# Patient Record
Sex: Female | Born: 1975 | Race: White | Hispanic: No | Marital: Married | Smoking: Former smoker
Health system: Southern US, Community
[De-identification: ages and names within clinical notes are randomized; demographics above are authoritative.]

## PROBLEM LIST (undated history)

## (undated) DIAGNOSIS — E119 Type 2 diabetes mellitus without complications: Secondary | ICD-10-CM

## (undated) HISTORY — DX: Type 2 diabetes mellitus without complications: E11.9

## (undated) HISTORY — PX: WISDOM TOOTH EXTRACTION: SHX21

---

## 2015-02-09 ENCOUNTER — Other Ambulatory Visit (HOSPITAL_COMMUNITY): Payer: Self-pay | Admitting: Family Medicine

## 2015-02-09 DIAGNOSIS — R1011 Right upper quadrant pain: Secondary | ICD-10-CM

## 2015-02-11 ENCOUNTER — Ambulatory Visit (HOSPITAL_COMMUNITY): Payer: 59

## 2015-02-13 ENCOUNTER — Ambulatory Visit (HOSPITAL_COMMUNITY): Payer: 59

## 2015-03-13 ENCOUNTER — Ambulatory Visit (HOSPITAL_COMMUNITY): Payer: 59

## 2015-03-27 ENCOUNTER — Ambulatory Visit (HOSPITAL_COMMUNITY)
Admission: RE | Admit: 2015-03-27 | Discharge: 2015-03-27 | Disposition: A | Discharge status: Home / Self Care | Payer: 59 | Source: Ambulatory Visit | Attending: Family Medicine | Admitting: Family Medicine

## 2015-03-27 DIAGNOSIS — R1011 Right upper quadrant pain: Secondary | ICD-10-CM | POA: Diagnosis present

## 2016-01-20 ENCOUNTER — Ambulatory Visit: Payer: Self-pay | Admitting: Gynecology

## 2016-02-05 ENCOUNTER — Encounter: Payer: Self-pay | Admitting: Gynecology

## 2016-02-05 ENCOUNTER — Ambulatory Visit (INDEPENDENT_AMBULATORY_CARE_PROVIDER_SITE_OTHER): Payer: BLUE CROSS/BLUE SHIELD | Admitting: Gynecology

## 2016-02-05 VITALS — BP 112/78 | Ht 62.0 in | Wt 154.0 lb

## 2016-02-05 DIAGNOSIS — R896 Abnormal cytological findings in specimens from other organs, systems and tissues: Secondary | ICD-10-CM

## 2016-02-05 DIAGNOSIS — IMO0002 Reserved for concepts with insufficient information to code with codable children: Secondary | ICD-10-CM | POA: Insufficient documentation

## 2016-02-05 NOTE — Progress Notes (Signed)
   Patient is a 40 year old gravida 3 para 3 who was referred to our practice as a courtesy of her PCP Dr. Keturah Barre at [facility] as a result of patient's abnormal Pap smear. We will reviewed his office notes which indicated the following:  Patient had an abnormal Pap smear 06/26/2015 whereby the interpretation was atypical squamous cells of undetermined significance negative HPV.  Patient had a repeat Pap smear 12/25/2015 whereby low-grade squamous intraepithelial lesion (mild dysplasia) was seen interpretation.  Patient stated that many years ago when she was younger she had some form of cervical biopsy but does not recall she had any form of treatment. She is currently using condoms for contraception.  Patient was counseled for colposcopic evaluation. A systematic inspection of the external genitalia, perineum, perirectal region failed to demonstrate any abnormality. The speculum was then introduced into the vagina. Acetic acid was applied. The vagina and fornix were inspected and no lesions were noted. There was evidence of a leukoplakic area at the 6:00 position of the ectocervix and no other lesions were seen. The endocervical speculum was utilized transformation sound was visualized as well. An ECC was obtained as well as a 6:00 ectocervical biopsy and submitted for histological evaluation. The patient below demonstrates the findings:  Physical Exam  Genitourinary:     Assessment/plan: Patient with prior history of ASCUS on Pap smear 6 months later Pap smear demonstrating low-grade squamous intraepithelial lesion. Detail colposcopic evaluation as described above. Biopsies obtained from the areas delineated above. Pathology report pending. Literature information was presented to the patient. The new ASCCP guidelines were discussed on management of abnormal Pap smears and biopsy results. Silver nitrate and Monsel solution was used for hemostasis at the biopsy sites.  Patient tolerated procedure well was given an Aleve before being released home.

## 2016-02-05 NOTE — Patient Instructions (Signed)
Colposcopy  Care After  Colposcopy is a procedure in which a special tool is used to magnify the surface of the cervix. A tissue sample (biopsy) may also be taken. This sample will be looked at for cervical cancer or other problems. After the test:  · You may have some cramping.  · Lie down for a few minutes if you feel lightheaded.  ·  You may have some bleeding which should stop in a few days.  HOME CARE  · Do not have sex or use tampons for 2 to 3 days or as told.  · Only take medicine as told by your doctor.  · Continue to take your birth control pills as usual.  Finding out the results of your test  Ask when your test results will be ready. Make sure you get your test results.  GET HELP RIGHT AWAY IF:  · You are bleeding a lot or are passing blood clots.  · You develop a fever of 102° F (38.9° C) or higher.  · You have abnormal vaginal discharge.  · You have cramps that do not go away with medicine.  · You feel lightheaded, dizzy, or pass out (faint).  MAKE SURE YOU:   · Understand these instructions.  · Will watch your condition.  · Will get help right away if you are not doing well or get worse.     This information is not intended to replace advice given to you by your health care provider. Make sure you discuss any questions you have with your health care provider.     Document Released: 05/16/2008 Document Revised: 02/20/2012 Document Reviewed: 06/27/2013  Elsevier Interactive Patient Education ©2016 [employer]

## 2016-04-21 ENCOUNTER — Telehealth: Payer: Self-pay

## 2016-04-21 NOTE — Telephone Encounter (Signed)
Week and repeat her Pap smear in 6 months instead of 3 months at that for close surveillance

## 2016-04-21 NOTE — Telephone Encounter (Signed)
Toniann FailWendy informed. 6 mos recall placed.

## 2016-04-21 NOTE — Telephone Encounter (Signed)
I received following note from AplingtonWendy at front desk:  Janelle FloorWendy R Lawrence  Clotile Whittington R Valjean Ruppel, RMA           This patient received her 4680m recall for pap with HPV testing. JF had in notes for her to return for that. She states she cannot afford to come in for this as she is still paying for the colpo bx pathology. Didn't know if this needed to be noted in the chart. Thanks

## 2017-03-02 ENCOUNTER — Emergency Department (HOSPITAL_COMMUNITY): Payer: BLUE CROSS/BLUE SHIELD

## 2017-03-02 ENCOUNTER — Encounter (HOSPITAL_COMMUNITY): Payer: Self-pay | Admitting: Nurse Practitioner

## 2017-03-02 ENCOUNTER — Emergency Department (HOSPITAL_COMMUNITY)
Admission: EM | Admit: 2017-03-02 | Discharge: 2017-03-02 | Disposition: A | Discharge status: Home / Self Care | Payer: BLUE CROSS/BLUE SHIELD | Attending: Emergency Medicine | Admitting: Emergency Medicine

## 2017-03-02 DIAGNOSIS — Z91040 Latex allergy status: Secondary | ICD-10-CM | POA: Diagnosis not present

## 2017-03-02 DIAGNOSIS — E119 Type 2 diabetes mellitus without complications: Secondary | ICD-10-CM | POA: Diagnosis not present

## 2017-03-02 DIAGNOSIS — Z79899 Other long term (current) drug therapy: Secondary | ICD-10-CM | POA: Insufficient documentation

## 2017-03-02 DIAGNOSIS — Z87891 Personal history of nicotine dependence: Secondary | ICD-10-CM | POA: Diagnosis not present

## 2017-03-02 DIAGNOSIS — R109 Unspecified abdominal pain: Secondary | ICD-10-CM | POA: Diagnosis present

## 2017-03-02 DIAGNOSIS — K8500 Idiopathic acute pancreatitis without necrosis or infection: Secondary | ICD-10-CM | POA: Insufficient documentation

## 2017-03-02 LAB — CBC WITH DIFFERENTIAL/PLATELET
BASOS ABS: 0 10*3/uL (ref 0.0–0.1)
BASOS PCT: 0 %
Eosinophils Absolute: 0.2 10*3/uL (ref 0.0–0.7)
Eosinophils Relative: 1 %
HEMATOCRIT: 47.8 % — AB (ref 36.0–46.0)
HEMOGLOBIN: 16.9 g/dL — AB (ref 12.0–15.0)
Lymphocytes Relative: 11 %
Lymphs Abs: 1.9 10*3/uL (ref 0.7–4.0)
MCH: 29.5 pg (ref 26.0–34.0)
MCHC: 35.4 g/dL (ref 30.0–36.0)
MCV: 83.4 fL (ref 78.0–100.0)
Monocytes Absolute: 0.6 10*3/uL (ref 0.1–1.0)
Monocytes Relative: 3 %
NEUTROS ABS: 14.1 10*3/uL — AB (ref 1.7–7.7)
NEUTROS PCT: 85 %
Platelets: 288 10*3/uL (ref 150–400)
RBC: 5.73 MIL/uL — ABNORMAL HIGH (ref 3.87–5.11)
RDW: 13 % (ref 11.5–15.5)
WBC: 16.9 10*3/uL — AB (ref 4.0–10.5)

## 2017-03-02 LAB — URINALYSIS, ROUTINE W REFLEX MICROSCOPIC
Bacteria, UA: NONE SEEN
Bilirubin Urine: NEGATIVE
Glucose, UA: NEGATIVE mg/dL
Ketones, ur: NEGATIVE mg/dL
Leukocytes, UA: NEGATIVE
Nitrite: NEGATIVE
PH: 7 (ref 5.0–8.0)
Protein, ur: NEGATIVE mg/dL
SPECIFIC GRAVITY, URINE: 1.013 (ref 1.005–1.030)

## 2017-03-02 LAB — COMPREHENSIVE METABOLIC PANEL
ALBUMIN: 4.2 g/dL (ref 3.5–5.0)
ALK PHOS: 113 U/L (ref 38–126)
ALT: 16 U/L (ref 14–54)
ANION GAP: 10 (ref 5–15)
AST: 19 U/L (ref 15–41)
BILIRUBIN TOTAL: 0.5 mg/dL (ref 0.3–1.2)
BUN: 13 mg/dL (ref 6–20)
CALCIUM: 9 mg/dL (ref 8.9–10.3)
CO2: 24 mmol/L (ref 22–32)
Chloride: 102 mmol/L (ref 101–111)
Creatinine, Ser: 0.85 mg/dL (ref 0.44–1.00)
GFR calc non Af Amer: >60 mL/min (ref >60)
GLUCOSE: 106 mg/dL — AB (ref 65–99)
POTASSIUM: 4.2 mmol/L (ref 3.5–5.1)
SODIUM: 136 mmol/L (ref 135–145)
TOTAL PROTEIN: 7.9 g/dL (ref 6.5–8.1)

## 2017-03-02 LAB — I-STAT BETA HCG BLOOD, ED (MC, WL, AP ONLY)

## 2017-03-02 LAB — POC URINE PREG, ED: PREG TEST UR: NEGATIVE

## 2017-03-02 LAB — LIPASE, BLOOD: Lipase: 315 U/L — ABNORMAL HIGH (ref 11–51)

## 2017-03-02 MED ORDER — MORPHINE SULFATE 15 MG PO TABS: 15.0000 mg | ORAL_TABLET | ORAL | 0 refills | Status: AC | PRN

## 2017-03-02 MED ORDER — SODIUM BICARBONATE 8.4 % IV SOLN: 100.0000 meq | Freq: Once | INTRAVENOUS | Status: DC

## 2017-03-02 MED ORDER — MORPHINE SULFATE (PF) 4 MG/ML IV SOLN
4.0000 mg | Freq: Once | INTRAVENOUS | Status: DC
Start: 2017-03-02 — End: 2017-03-02

## 2017-03-02 MED ORDER — ONDANSETRON HCL 4 MG/2ML IJ SOLN: 4.0000 mg | Freq: Once | INTRAMUSCULAR | Status: DC

## 2017-03-02 MED ORDER — ONDANSETRON 4 MG PO TBDP: ORAL_TABLET | ORAL | 0 refills | Status: AC

## 2017-03-02 NOTE — ED Notes (Signed)
Bed: WA11 Expected date:  Expected time:  Means of arrival:  Comments: EMS abdominal pain 

## 2017-03-02 NOTE — Discharge Instructions (Signed)
Follow up with your PCP and GI.   

## 2017-03-02 NOTE — ED Notes (Signed)
Patient transported to CT 

## 2017-03-02 NOTE — ED Triage Notes (Signed)
Patient was driving had a sudden onset of nausea. Patient went home and nausea continued with dizziness. Patient also has severe right lower abdominal pain. Patient report she has an IUD no chance of pregnancy and she had her period last week.

## 2017-03-02 NOTE — ED Provider Notes (Signed)
[facility]-EMERGENCY DEPT Provider Note   CSN: [id number] Arrival date & time: 03/02/17  [id number]     History   Chief Complaint No chief complaint on file.   HPI Taylor GoodellJennifer Hill is a 41 y.o. female.  [age] yoF with a chief complaint of right-sided abdominal pain. This been going and coming for the past week or so. Patient is severe when it happens feels like a cramp radiates down to her pelvis. Nothing seems to make it better or worse and then spontaneously resolves. Today she had recurrence of this pain had some retching but no vomiting. Denies fevers or chills. Denies dysuria. Patient has had some vaginal bleeding and discharge since she had a IUD placed. She denies likelihood being pregnant.   The history is provided by the patient.  Abdominal Pain   This is a new problem. The current episode started more than 2 days ago. The problem occurs constantly. The problem has not changed since onset.The pain is located in the periumbilical region. The quality of the pain is shooting and sharp. The pain is at a severity of 1/10. The pain is severe. Associated symptoms include nausea. Pertinent negatives include fever, vomiting, dysuria, headaches, arthralgias and myalgias. Nothing aggravates the symptoms. Nothing relieves the symptoms.    Past Medical History:  Diagnosis Date  . Diabetes mellitus without complication [facility](HCC)    gestational with 3rd pregnancy     Patient Active Problem List   Diagnosis Date Noted  . LGSIL (low grade squamous intraepithelial dysplasia) 02/05/2016    Past Surgical History:  Procedure Laterality Date  . WISDOM TOOTH EXTRACTION      OB History    Gravida Para Term Preterm AB Living   3 3       3    SAB TAB Ectopic Multiple Live Births                   Home Medications    Prior to Admission medications   Medication Sig Start Date End Date Taking? Authorizing Provider  ADDERALL XR 10 MG 24 hr capsule Take 10 mg by mouth daily. 02/23/17  Yes Historical  Provider, MD  levonorgestrel (MIRENA) 20 MCG/24HR IUD 1 each by Intrauterine route once.   Yes Historical Provider, MD  Multiple Vitamin (MULTIVITAMIN WITH MINERALS) TABS tablet Take 1 tablet by mouth daily.   Yes Historical Provider, MD  OVER THE COUNTER MEDICATION Take 1 tablet by mouth daily. Ashwagandha supplement   Yes Historical Provider, MD  morphine (MSIR) 15 MG tablet Take 1 tablet (15 mg total) by mouth every 4 (four) hours as needed for severe pain. 03/02/17   Melene Planan Tina Gruner, DO  ondansetron (ZOFRAN ODT) 4 MG disintegrating tablet 4mg  ODT q4 hours prn nausea/vomit 03/02/17   Melene Planan Cady Hafen, DO    Family History Family History  Problem Relation Age of Onset  . Diabetes Mother   . Hypertension Mother   . Cancer Father     skin  . Breast cancer Maternal Grandmother   . Cancer Paternal Grandmother     melanoma    Social History Social History  Substance Use Topics  . Smoking status: Former Games developermoker  . Smokeless tobacco: Never Used  . Alcohol use 0.0 oz/week     Comment: rare     Allergies   Latex   Review of Systems Review of Systems  Constitutional: Negative for chills and fever.  HENT: Negative for congestion and rhinorrhea.   Eyes: Negative for redness and visual disturbance.  Respiratory:  Negative for shortness of breath and wheezing.   Cardiovascular: Negative for chest pain and palpitations.  Gastrointestinal: Positive for abdominal pain (R sided) and nausea. Negative for vomiting.  Genitourinary: Negative for dysuria and urgency.  Musculoskeletal: Negative for arthralgias and myalgias.  Skin: Negative for pallor and wound.  Neurological: Negative for dizziness and headaches.     Physical Exam Updated Vital Signs BP 105/67   Pulse 86   Resp 17   Ht 5\' 1"  (1.549 m)   Wt 144 lb (65.3 kg)   SpO2 100%   BMI 27.21 kg/m   Physical Exam  Constitutional: She is oriented to person, place, and time. She appears well-developed and well-nourished. No distress.  HENT:    Head: Normocephalic and atraumatic.  Eyes: EOM are normal. Pupils are equal, round, and reactive to light.  Neck: Normal range of motion. Neck supple.  Cardiovascular: Normal rate and regular rhythm.  Exam reveals no gallop and no friction rub.   No murmur heard. Pulmonary/Chest: Effort normal. She has no wheezes. She has no rales.  Abdominal: Soft. She exhibits no distension and no mass. There is tenderness (R periumbilical pain). There is no guarding.  Musculoskeletal: She exhibits no edema or tenderness.  Neurological: She is alert and oriented to person, place, and time.  Skin: Skin is warm and dry. She is not diaphoretic.  Psychiatric: She has a normal mood and affect. Her behavior is normal.  Nursing note and vitals reviewed.    ED Treatments / Results  Labs (all labs ordered are listed, but only abnormal results are displayed) Labs Reviewed  CBC WITH DIFFERENTIAL/PLATELET - Abnormal; Notable for the following:       Result Value   WBC 16.9 (*)    RBC 5.73 (*)    Hemoglobin 16.9 (*)    HCT 47.8 (*)    Neutro Abs 14.1 (*)    All other components within normal limits  COMPREHENSIVE METABOLIC PANEL - Abnormal; Notable for the following:    Glucose, Bld 106 (*)    All other components within normal limits  LIPASE, BLOOD - Abnormal; Notable for the following:    Lipase 315 (*)    All other components within normal limits  URINALYSIS, ROUTINE W REFLEX MICROSCOPIC - Abnormal; Notable for the following:    Hgb urine dipstick SMALL (*)    Squamous Epithelial / LPF 0-5 (*)    All other components within normal limits  I-STAT BETA HCG BLOOD, ED ([facility], [facility], [facility] ONLY)  POC URINE PREG, ED    EKG  EKG Interpretation None       Radiology No results found.  Procedures Procedures (including critical care time)  Medications Ordered in ED Medications  morphine 4 MG/ML injection 4 mg (not administered)  ondansetron (ZOFRAN) injection 4 mg (not administered)     Initial  Impression / Assessment and Plan / ED Course  I have reviewed the triage vital signs and the nursing notes.  Pertinent labs & imaging results that were available during my care of the patient were reviewed by me and considered in my medical decision making (see chart for details).     41 yo F With a chief complaint of right-sided abdominal pain. Colicky and history sounds like a kidney stone. Will obtain a CT stone study labs reassess.  Patient with a lipase of 315. CT scan with diffuse abdominal inflammation suggestive of infection or inflammatory process per radiology. Suspect this is due to the pancreatitis. Patient had been  taking a new herbal supplement. On reassessment the patient is well-appearing and nontoxic. She is having no ongoing pain. She is able to tolerate by mouth. Discussed with her that maybe she should stop taking that follow-up with her family physician.  1:32 PM:  I have discussed the diagnosis/risks/treatment options with the patient and family and believe the pt to be eligible for discharge home to follow-up with PCP. We also discussed returning to the ED immediately if new or worsening sx occur. We discussed the sx which are most concerning (e.g., sudden worsening pain, fever, inability to tolerate by mouth) that necessitate immediate return. Medications administered to the patient during their visit and any new prescriptions provided to the patient are listed below.  Medications given during this visit Medications  morphine 4 MG/ML injection 4 mg (not administered)  ondansetron (ZOFRAN) injection 4 mg (not administered)     The patient appears reasonably screen and/or stabilized for discharge and I doubt any other medical condition or other [facility] requiring further screening, evaluation, or treatment in the ED at this time prior to discharge.    Final Clinical Impressions(s) / ED Diagnoses   Final diagnoses:  Idiopathic acute pancreatitis without infection or necrosis     New Prescriptions New Prescriptions   MORPHINE (MSIR) 15 MG TABLET    Take 1 tablet (15 mg total) by mouth every 4 (four) hours as needed for severe pain.   ONDANSETRON (ZOFRAN ODT) 4 MG DISINTEGRATING TABLET    4mg  ODT q4 hours prn nausea/vomit     Melene Plan, DO 03/02/17 1332

## 2017-04-26 ENCOUNTER — Encounter: Payer: Self-pay | Admitting: Gynecology

## 2018-10-14 IMAGING — CT CT RENAL STONE PROTOCOL
2 of 3 series · 15 of 46 positions shown, 17 images · non-contrast
Comparison: None.

CLINICAL DATA: Sudden onset of nausea with associated dizziness.
Severe right lower abdominal pain. Right flank pain. Personal
history of diabetes.

EXAM:
CT ABDOMEN AND PELVIS WITHOUT CONTRAST
TECHNIQUE: Multidetector CT imaging of the abdomen and pelvis was performed
following the standard protocol without IV contrast.

[Series 3: lung · axial · 0.74mm/px · z∈[-138,-32]mm · 12 of 61 slices shown, 14 images]
[im 4/61  soft-tissue]
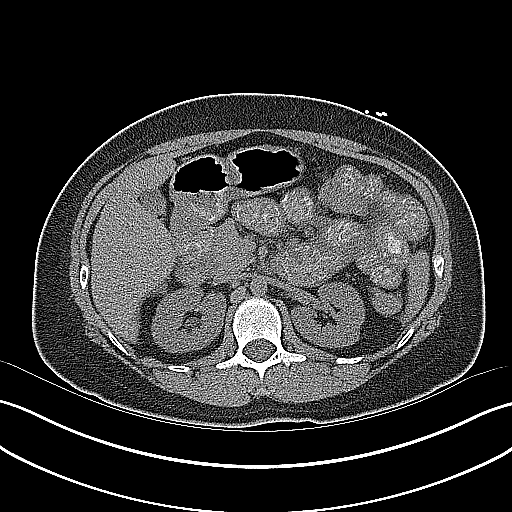
[im 4/61  bone]
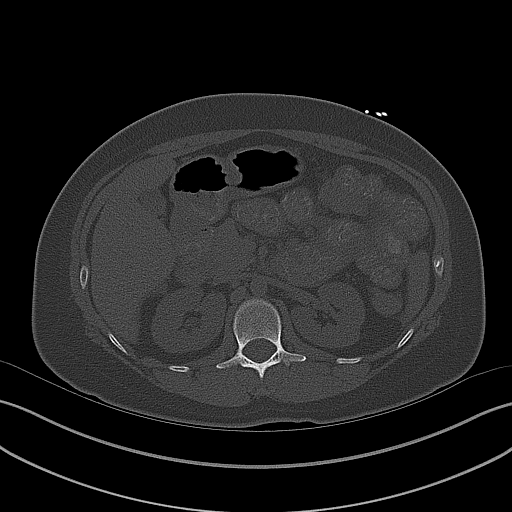
[im 8/61  soft-tissue]
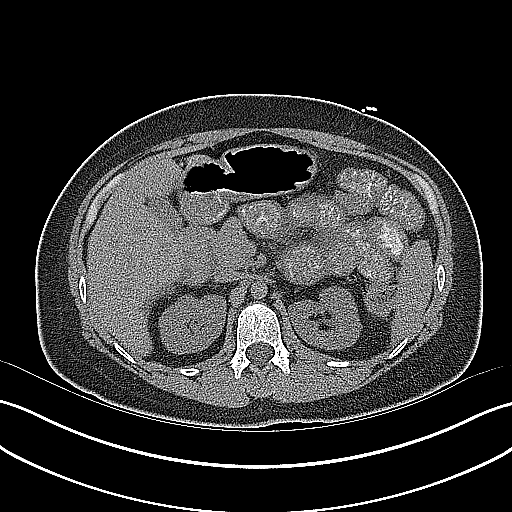
[im 14/61  soft-tissue]
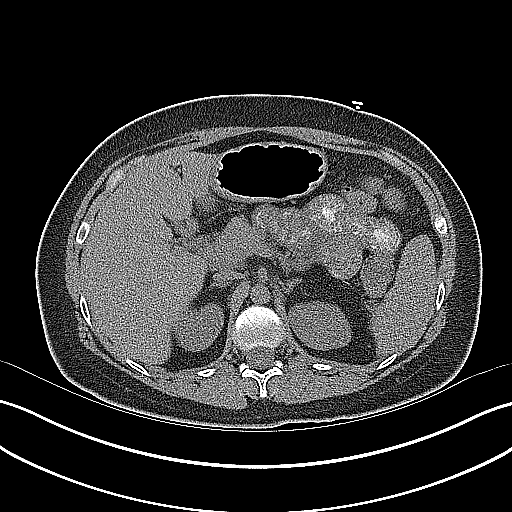
[im 18/61  soft-tissue]
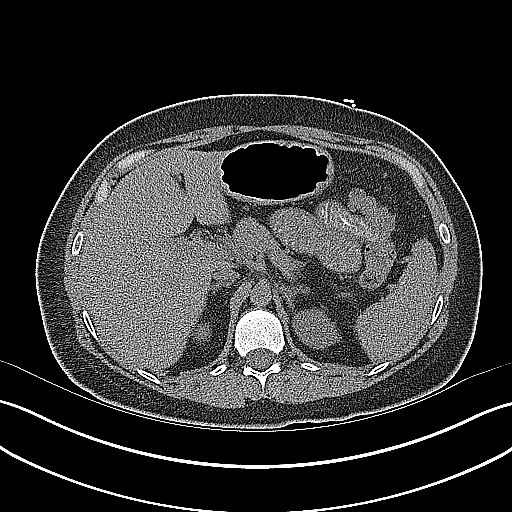
[im 24/61  soft-tissue]
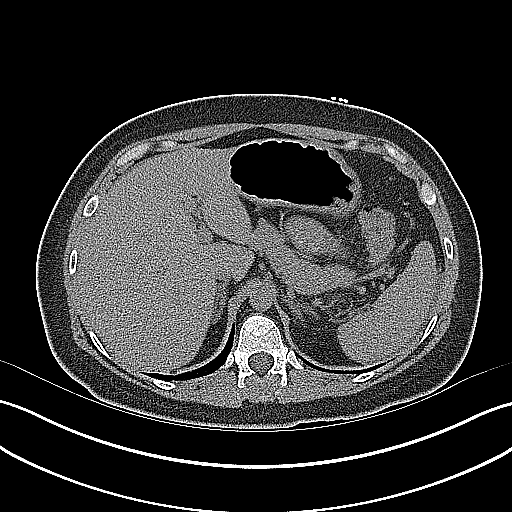
[im 28/61  soft-tissue]
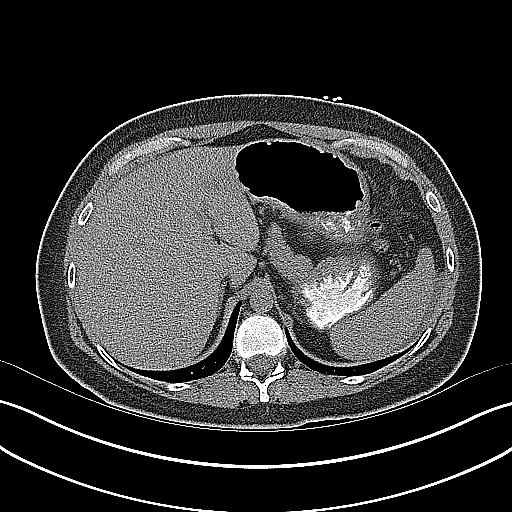
[im 33/61  soft-tissue]
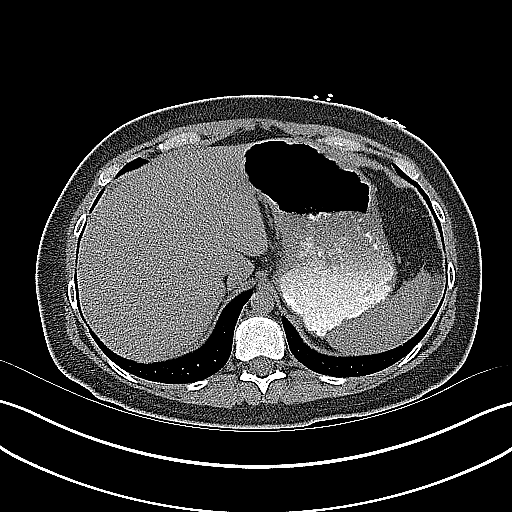
[im 37/61  soft-tissue]
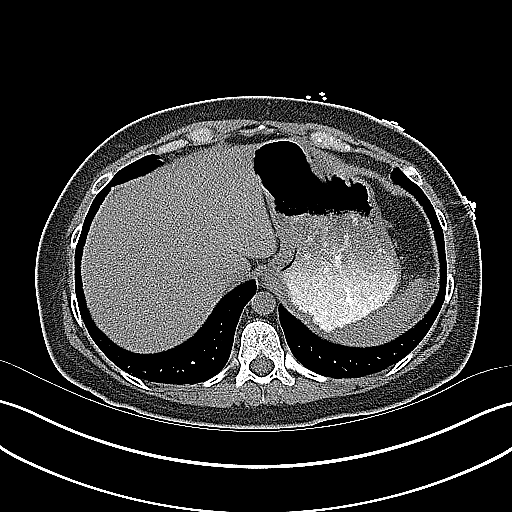
[im 43/61  soft-tissue]
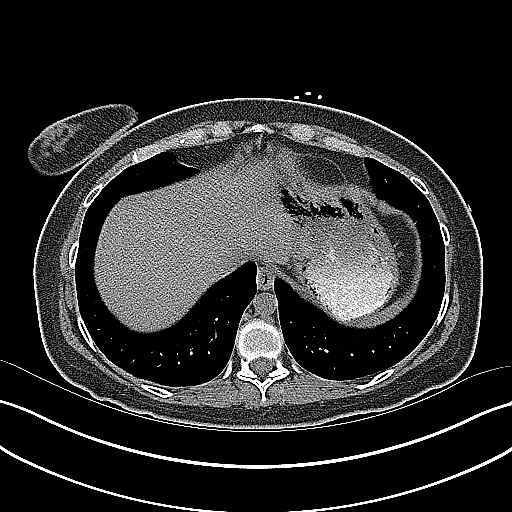
[im 43/61  bone]
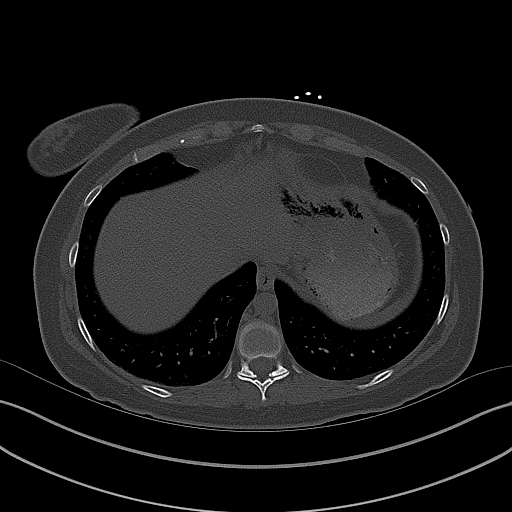
[im 47/61  soft-tissue]
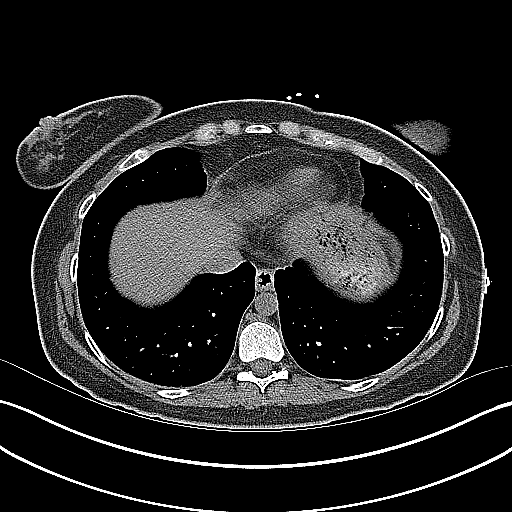
[im 53/61  soft-tissue]
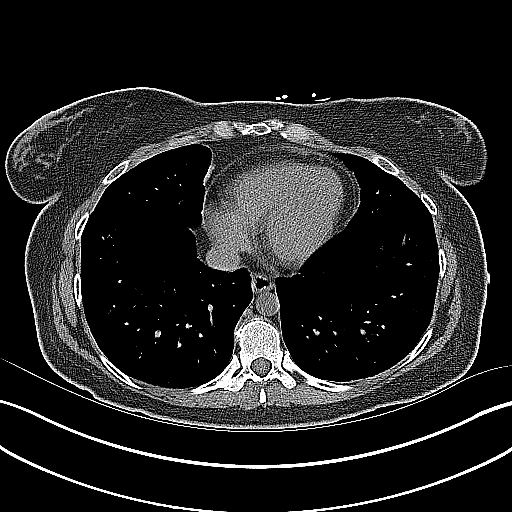
[im 57/61  soft-tissue]
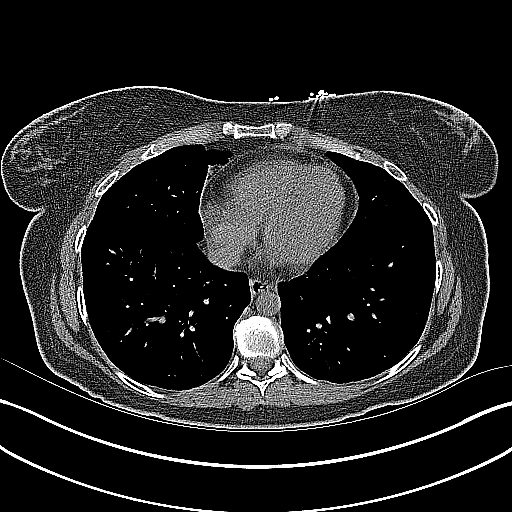

[Series 4: coronal · coronal · 0.73mm/px · 3 of 135 slices shown]
[im 45/135  soft-tissue]
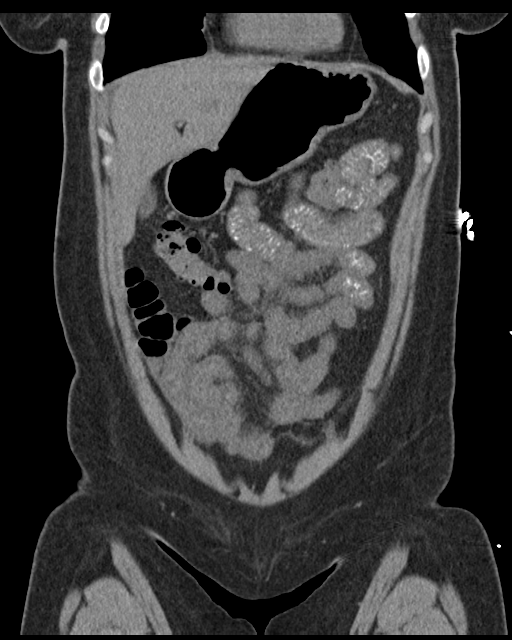
[im 60/135  soft-tissue]
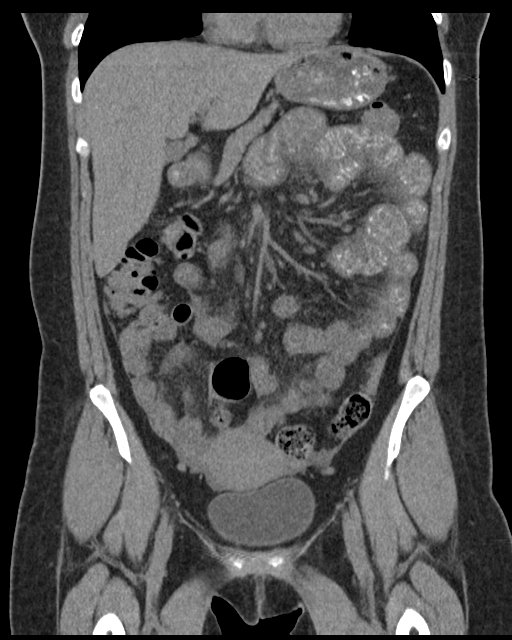
[im 75/135  soft-tissue]
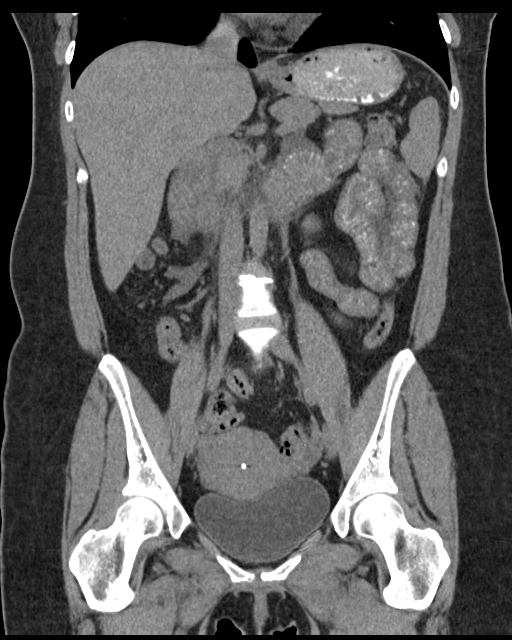

[15 of 46 positions shown; findings below may reference images not displayed]

FINDINGS: Lower chest: The lung bases are clear without focal nodule, mass, or
airspace disease. The heart size is normal. No significant pleural
or pericardial effusion is present.

Hepatobiliary: No focal hepatic lesions are present. The common bile
duct and gallbladder are normal.

Pancreas: Unremarkable. No pancreatic ductal dilatation or
surrounding inflammatory changes.

Spleen: Normal in size without focal abnormality.

Adrenals/Urinary Tract: The adrenal glands are normal bilaterally.
Kidneys and ureters are within normal limits. There is no stone or
obstruction. The urinary bladder is within normal limits.

Stomach/Bowel: The stomach is mildly distended. No discrete lesions
are present. The duodenum is unremarkable. There is diffuse
stranding throughout the small bowel mesentery. Mild diffuse wall
thickening is present in the proximal small bowel. No focal
inflammation or discrete mass lesion is present. The appendix is
visualized and normal. The ascending and transverse colon
demonstrates similar mild surrounding inflammatory change. The
descending colon is mostly collapsed. There is stool in the sigmoid
colon without obstruction or mass.

Vascular/Lymphatic: No significant vascular findings are present. No
enlarged abdominal or pelvic lymph nodes.

Reproductive: IUD is in place. The uterus and adnexa are otherwise
within normal limits.

Other: A small amount of intraperitoneal free fluid is present
within the cul-de-sac and anterior to the uterus. This is likely
inflammatory, associated with the diffuse small bowel inflammation.
There is no free air. No significant ventral hernia is present.

Musculoskeletal: No focal lytic or blastic lesions are present.
Vertebral body heights alignment are maintained. The bony pelvis is
intact. The hips are located.
IMPRESSION: 1. Diffuse small bowel inflammatory changes with mild diffuse
proximal wall thickening and extensive mesenteric inflammation
compatible with a nonspecific infectious or inflammatory enteritis.
Angioedema of the small bowel is also considered given the acute
onset.
2. No evidence for rupture or focal abscess.
3. Normal appearance of the appendix and bilateral kidneys.

## 2020-02-08 ENCOUNTER — Ambulatory Visit: Payer: BLUE CROSS/BLUE SHIELD | Attending: Internal Medicine

## 2020-02-08 DIAGNOSIS — Z23 Encounter for immunization: Secondary | ICD-10-CM | POA: Insufficient documentation

## 2020-02-08 NOTE — Progress Notes (Signed)
   Covid-19 Vaccination Clinic  Name:  Taylor Hill    MRN: [id number] DOB: [birth year]  02/08/2020  [name] was observed post Covid-19 immunization for 15 minutes without incidence. She was provided with Vaccine Information Sheet and instruction to access the V-Safe system.   [name] was instructed to call 911 with any severe reactions post vaccine: Marland Kitchen Difficulty breathing  . Swelling of your face and throat  . A fast heartbeat  . A bad rash all over your body  . Dizziness and weakness    Immunizations Administered    Name Date Dose VIS Date Route   Pfizer COVID-19 Vaccine 02/08/2020 11:49 AM 0.3 mL 11/22/2019 Intramuscular   Manufacturer: [employer], [employer]   Lot: [id number]   NDC: [address]-2

## 2020-03-04 ENCOUNTER — Ambulatory Visit: Payer: BLUE CROSS/BLUE SHIELD | Attending: Internal Medicine

## 2020-03-04 DIAGNOSIS — Z23 Encounter for immunization: Secondary | ICD-10-CM

## 2020-03-04 NOTE — Progress Notes (Signed)
   Covid-19 Vaccination Clinic  Name:  Milisa Kimbell    MRN: [id number] DOB: 1976-07-19  03/04/2020  Ms. Tatum was observed post Covid-19 immunization for 15 minutes without incident. She was provided with Vaccine Information Sheet and instruction to access the V-Safe system.   Ms. Uhlig was instructed to call 911 with any severe reactions post vaccine: Marland Kitchen Difficulty breathing  . Swelling of face and throat  . A fast heartbeat  . A bad rash all over body  . Dizziness and weakness   Immunizations Administered    Name Date Dose VIS Date Route   Pfizer COVID-19 Vaccine 03/04/2020  3:05 PM 0.3 mL 11/22/2019 Intramuscular   Manufacturer: [employer], [employer]   Lot: [id number]   NDC: [address]-5
